# Patient Record
Sex: Female | Born: 1970 | Race: White | Hispanic: No | Marital: Married | Smoking: Never smoker
Health system: Southern US, Community
[De-identification: ages and names within clinical notes are randomized; demographics above are authoritative.]

---

## 2020-09-21 ENCOUNTER — Other Ambulatory Visit: Payer: Self-pay

## 2020-09-21 ENCOUNTER — Ambulatory Visit
Admission: EM | Admit: 2020-09-21 | Discharge: 2020-09-21 | Disposition: A | Discharge status: Home / Self Care | Payer: BC Managed Care – PPO | Attending: Family Medicine | Admitting: Family Medicine

## 2020-09-21 ENCOUNTER — Ambulatory Visit: Admit: 2020-09-21 | Disposition: A | Discharge status: Home / Self Care | Payer: Self-pay

## 2020-09-21 DIAGNOSIS — J0110 Acute frontal sinusitis, unspecified: Secondary | ICD-10-CM | POA: Diagnosis not present

## 2020-09-21 MED ORDER — AMOXICILLIN 500 MG PO CAPS: 1000.0000 mg | ORAL_CAPSULE | Freq: Three times a day (TID) | ORAL | 0 refills | Status: AC

## 2020-09-21 NOTE — ED Provider Notes (Signed)
Renaldo Fiddler    CSN: [id number] Arrival date & time: 09/21/20  1310      History   Chief Complaint Chief Complaint  Patient presents with  . Cough    HPI Raven Thomas is Thomas 49 y.o. female.   Patient is Thomas 49 year old female who presents today with nonproductive cough, nasal congestion, rhinorrhea, feeling fatigue since 12/23.  Symptoms are worsening.  Taking over-the-counter medicines without much relief.  Sinus pressure and headache. No fever.       History reviewed. No pertinent past medical history.  There are no problems to display for this patient.   History reviewed. No pertinent surgical history.  OB History   No obstetric history on file.      Home Medications    Prior to Admission medications   Medication Sig Start Date End Date Taking? Authorizing Provider  amoxicillin (AMOXIL) 500 MG capsule Take 2 capsules (1,000 mg total) by mouth 3 (three) times daily for 5 days. 09/21/20 09/26/20 Yes Janace Aris, NP    Family History No family history on file.  Social History Social History   Tobacco Use  . Smoking status: Never Smoker  . Smokeless tobacco: Never Used  Substance Use Topics  . Alcohol use: Never  . Drug use: Never     Allergies   Patient has no known allergies.   Review of Systems Review of Systems   Physical Exam Triage Vital Signs ED Triage Vitals  Enc Vitals Group     BP 09/21/20 1316 111/73     Pulse Rate 09/21/20 1316 70     Resp 09/21/20 1316 16     Temp 09/21/20 1316 98.1 F (36.7 C)     Temp Source 09/21/20 1316 Oral     SpO2 09/21/20 1316 96 %     Weight 09/21/20 1319 210 lb (95.3 kg)     Height 09/21/20 1319 5\' 5"  (1.651 m)     Head Circumference --      Peak Flow --      Pain Score 09/21/20 1319 0     Pain Loc --      Pain Edu? --      Excl. in [place]? --    No data found.  Updated Vital Signs BP 111/73 (BP Location: Left Arm)   Pulse 70   Temp 98.1 F (36.7 C) (Oral)   Resp 16   Ht 5\' 5"   (1.651 m)   Wt 210 lb (95.3 kg)   LMP 08/31/2020   SpO2 96%   BMI 34.95 kg/m   Visual Acuity Right Eye Distance:   Left Eye Distance:   Bilateral Distance:    Right Eye Near:   Left Eye Near:    Bilateral Near:     Physical Exam Vitals and nursing note reviewed.  Constitutional:      General: She is not in acute distress.    Appearance: Normal appearance. She is not ill-appearing, toxic-appearing or diaphoretic.  HENT:     Head: Normocephalic.     Right Ear: Tympanic membrane and ear canal normal.     Left Ear: Tympanic membrane and ear canal normal.     Nose: Congestion and rhinorrhea present.     Mouth/Throat:     Pharynx: Oropharynx is clear.  Eyes:     Conjunctiva/sclera: Conjunctivae normal.  Cardiovascular:     Rate and Rhythm: Normal rate and regular rhythm.  Pulmonary:     Effort: Pulmonary effort is normal.  Comments: Decreased lung sounds to right lower Musculoskeletal:        General: Normal range of motion.     Cervical back: Normal range of motion.  Skin:    General: Skin is warm and dry.     Findings: No rash.  Neurological:     Mental Status: She is alert.  Psychiatric:        Mood and Affect: Mood normal.      UC Treatments / Results  Labs (all labs ordered are listed, but only abnormal results are displayed) Labs Reviewed  NOVEL CORONAVIRUS, NAA    EKG   Radiology No results found.  Procedures Procedures (including critical care time)  Medications Ordered in UC Medications - No data to display  Initial Impression / Assessment and Plan / UC Course  I have reviewed the triage vital signs and the nursing notes.  Pertinent labs & imaging results that were available during my care of the patient were reviewed by me and considered in my medical decision making (see chart for details).     Acute nonrecurrent frontal sinusitis. Patient with 7 days of symptoms and worsening.  We will go ahead and cover with antibiotics this  time. Amoxicillin as prescribed.  Flonase and Zyrtec for symptoms Follow up as needed for continued or worsening symptoms  Final Clinical Impressions(s) / UC Diagnoses   Final diagnoses:  Acute non-recurrent frontal sinusitis     Discharge Instructions     Take the antibiotics as prescribed.  You can continue over-the-counter medicines as needed.  Recommended  Flonase and Zyrtec Follow up as needed for continued or worsening symptoms     ED Prescriptions    Medication Sig Dispense Auth. Provider   amoxicillin (AMOXIL) 500 MG capsule Take 2 capsules (1,000 mg total) by mouth 3 (three) times daily for 5 days. 30 capsule Raven Weich A, NP     PDMP not reviewed this encounter.   Janace Aris, NP 09/21/20 1348

## 2020-09-21 NOTE — Discharge Instructions (Addendum)
Take the antibiotics as prescribed.  You can continue over-the-counter medicines as needed.  Recommended  Flonase and Zyrtec Follow up as needed for continued or worsening symptoms

## 2020-09-21 NOTE — ED Triage Notes (Signed)
Pt reports having non productive cough, nasal congestion and feeling fatigue since 12/23.

## 2020-09-23 LAB — SARS-COV-2, NAA 2 DAY TAT

## 2020-09-23 LAB — NOVEL CORONAVIRUS, NAA: SARS-CoV-2, NAA: NOT DETECTED

## 2020-10-12 ENCOUNTER — Other Ambulatory Visit: Payer: Self-pay | Admitting: Family

## 2020-10-12 DIAGNOSIS — R1900 Intra-abdominal and pelvic swelling, mass and lump, unspecified site: Secondary | ICD-10-CM

## 2020-10-12 DIAGNOSIS — R1909 Other intra-abdominal and pelvic swelling, mass and lump: Secondary | ICD-10-CM

## 2020-10-17 ENCOUNTER — Other Ambulatory Visit: Payer: Self-pay

## 2020-10-17 ENCOUNTER — Ambulatory Visit
Admission: RE | Admit: 2020-10-17 | Discharge: 2020-10-17 | Disposition: A | Discharge status: Home / Self Care | Payer: BC Managed Care – PPO | Source: Ambulatory Visit | Attending: Family | Admitting: Family

## 2020-10-17 DIAGNOSIS — R1909 Other intra-abdominal and pelvic swelling, mass and lump: Secondary | ICD-10-CM | POA: Diagnosis present

## 2022-01-14 ENCOUNTER — Other Ambulatory Visit: Payer: Self-pay | Admitting: Family Medicine

## 2022-01-14 DIAGNOSIS — Z1231 Encounter for screening mammogram for malignant neoplasm of breast: Secondary | ICD-10-CM

## 2022-02-13 ENCOUNTER — Ambulatory Visit
Admission: RE | Admit: 2022-02-13 | Discharge: 2022-02-13 | Disposition: A | Discharge status: Home / Self Care | Payer: BC Managed Care – PPO | Source: Ambulatory Visit | Attending: Family Medicine | Admitting: Family Medicine

## 2022-02-13 DIAGNOSIS — Z1231 Encounter for screening mammogram for malignant neoplasm of breast: Secondary | ICD-10-CM | POA: Insufficient documentation

## 2022-02-15 ENCOUNTER — Inpatient Hospital Stay
Admission: RE | Admit: 2022-02-15 | Discharge: 2022-02-15 | Disposition: A | Discharge status: Home / Self Care | Payer: Self-pay | Source: Ambulatory Visit | Attending: *Deleted | Admitting: *Deleted

## 2022-02-15 ENCOUNTER — Other Ambulatory Visit: Payer: Self-pay | Admitting: *Deleted

## 2022-02-15 DIAGNOSIS — Z1231 Encounter for screening mammogram for malignant neoplasm of breast: Secondary | ICD-10-CM

## 2022-10-21 ENCOUNTER — Other Ambulatory Visit: Payer: Self-pay

## 2022-10-21 ENCOUNTER — Encounter: Payer: Self-pay | Admitting: Emergency Medicine

## 2022-10-21 ENCOUNTER — Emergency Department: Payer: BC Managed Care – PPO

## 2022-10-21 ENCOUNTER — Emergency Department
Admission: EM | Admit: 2022-10-21 | Discharge: 2022-10-21 | Disposition: A | Discharge status: Home / Self Care | Payer: BC Managed Care – PPO | Attending: Emergency Medicine | Admitting: Emergency Medicine

## 2022-10-21 DIAGNOSIS — S01111A Laceration without foreign body of right eyelid and periocular area, initial encounter: Secondary | ICD-10-CM | POA: Diagnosis not present

## 2022-10-21 DIAGNOSIS — W01198A Fall on same level from slipping, tripping and stumbling with subsequent striking against other object, initial encounter: Secondary | ICD-10-CM | POA: Insufficient documentation

## 2022-10-21 DIAGNOSIS — R55 Syncope and collapse: Secondary | ICD-10-CM | POA: Insufficient documentation

## 2022-10-21 DIAGNOSIS — S0990XA Unspecified injury of head, initial encounter: Secondary | ICD-10-CM | POA: Diagnosis present

## 2022-10-21 LAB — BASIC METABOLIC PANEL
Anion gap: 7 (ref 5–15)
BUN: 19 mg/dL (ref 6–20)
CO2: 24 mmol/L (ref 22–32)
Calcium: 8.1 mg/dL — ABNORMAL LOW (ref 8.9–10.3)
Chloride: 101 mmol/L (ref 98–111)
Creatinine, Ser: 0.72 mg/dL (ref 0.44–1.00)
GFR, Estimated: >60 mL/min (ref >60)
Glucose, Bld: 130 mg/dL — ABNORMAL HIGH (ref 70–99)
Potassium: 3.7 mmol/L (ref 3.5–5.1)
Sodium: 132 mmol/L — ABNORMAL LOW (ref 135–145)

## 2022-10-21 LAB — CBC
HCT: 38.9 % (ref 36.0–46.0)
Hemoglobin: 12.3 g/dL (ref 12.0–15.0)
MCH: 26.8 pg (ref 26.0–34.0)
MCHC: 31.6 g/dL (ref 30.0–36.0)
MCV: 84.7 fL (ref 80.0–100.0)
Platelets: 279 10*3/uL (ref 150–400)
RBC: 4.59 MIL/uL (ref 3.87–5.11)
RDW: 14.4 % (ref 11.5–15.5)
WBC: 8.2 10*3/uL (ref 4.0–10.5)
nRBC: 0 % (ref 0.0–0.2)

## 2022-10-21 MED ORDER — SODIUM CHLORIDE 0.9 % IV BOLUS
500.0000 mL | Freq: Once | INTRAVENOUS | Status: AC
  Administered 2022-10-21: 500 mL via INTRAVENOUS

## 2022-10-21 NOTE — ED Provider Notes (Signed)
[facility] Provider Note    Event Date/Time   First MD Initiated Contact with Patient 10/21/22 1647     (approximate)   History   Loss of Consciousness   HPI  Raven Thomas is a 52 y.o. female with a history of allergies, depression, and GERD who presents with syncope and head injury acute onset this afternoon.  The patient states that she had been lying down for a while and then was having diarrhea.  She got up to leave the house and suddenly started to feel lightheaded for a few moments and then passed out.  She has had previous episodes of syncope although in the past usually feels dizzy for a little bit longer.  She fell and hit her head just above her right eyebrow.  She denies any other injuries.  Her blood pressure was low initially but it has now improved.  She denies any acute symptoms and states she is feeling much better.  I reviewed the past medical records.  The patient's most recent outpatient visit was on 06/04/2022 for ankle pain.  She had a telephone encounter on 12/29 with internal medicine for cough and URI symptoms.   Physical Exam   Triage Vital Signs: ED Triage Vitals  Enc Vitals Group     BP 10/21/22 1436 (!) 85/55     Pulse Rate 10/21/22 1436 64     Resp 10/21/22 1436 18     Temp 10/21/22 1436 98.6 F (37 C)     Temp Source 10/21/22 1436 Oral     SpO2 10/21/22 1436 97 %     Weight 10/21/22 1416 210 lb 1.6 oz (95.3 kg)     Height 10/21/22 1416 5\' 5"  (1.651 m)     Head Circumference --      Peak Flow --      Pain Score 10/21/22 1434 7     Pain Loc --      Pain Edu? --      Excl. in [place]? --     Most recent vital signs: Vitals:   10/21/22 1700 10/21/22 1730  BP: 122/66 (!) 117/58  Pulse: 68 68  Resp: 16   Temp:    SpO2: 100% 100%     General: Awake, no distress.  CV:  Good peripheral perfusion.  Resp:  Normal effort.  Abd:  No distention.  Other:  EOMI.  PERRLA.  Motor and sensory intact in all extremities.  No  facial droop.  No ataxia on finger-to-nose.  Normal coordination.  2 cm superficial laceration just above the lateral right eyebrow.   ED Results / Procedures / Treatments   Labs (all labs ordered are listed, but only abnormal results are displayed) Labs Reviewed  BASIC METABOLIC PANEL - Abnormal; Notable for the following components:      Result Value   Sodium 132 (*)    Glucose, Bld 130 (*)    Calcium 8.1 (*)    All other components within normal limits  CBC  URINALYSIS, ROUTINE W REFLEX MICROSCOPIC  CBG MONITORING, ED  POC URINE PREG, ED     EKG  ED ECG REPORT I, Arta Silence, the attending physician, personally viewed and interpreted this ECG.  Date: 10/21/2022 EKG Time: 1448 Rate: 64 Rhythm: normal sinus rhythm QRS Axis: normal Intervals: normal ST/T Wave abnormalities: normal Narrative Interpretation: no evidence of acute ischemia    RADIOLOGY  CT head: I independently viewed and interpreted the images; there is no ICH.  Radiology report indicates no acute abnormality  PROCEDURES:  Critical Care performed: No  ..Laceration Repair  Date/Time: 10/21/2022 5:51 PM  Performed by: Arta Silence, MD Authorized by: Arta Silence, MD   Consent:    Consent obtained:  Verbal   Consent given by:  Patient Universal protocol:    Patient identity confirmed:  Verbally with patient Anesthesia:    Anesthesia method:  None Laceration details:    Location:  Face   Face location:  R eyebrow   Length (cm):  2 Treatment:    Amount of cleaning:  Standard Skin repair:    Repair method:  Tissue adhesive Approximation:    Approximation:  Close Repair type:    Repair type:  Simple Post-procedure details:    Procedure completion:  Tolerated well, no immediate complications    MEDICATIONS ORDERED IN ED: Medications  sodium chloride 0.9 % bolus 500 mL (0 mLs Intravenous Stopped 10/21/22 1656)     IMPRESSION / MDM / [place] / ED  COURSE  I reviewed the triage vital signs and the nursing notes.  52 year old female with PMH as noted above presents with a syncopal episode after having diarrhea and then getting up relatively quickly.  She was diagnosed with COVID-19 a few days ago.  Physical exam is unremarkable for acute findings.  Differential diagnosis includes, but is not limited to, vasovagal syncope, orthostatic hypotension, dehydration/hypovolemia.  There is no evidence of cardiac etiology.  EKG shows no acute findings.  Basic labs were obtained and show no acute abnormalities.  Electrolytes are normal.  There is no leukocytosis.  CT head was also obtained due to the head injury and it is negative.  The patient was slightly hypotensive on arrival but was given a small fluid bolus and the blood pressure is now normal.  She is asymptomatic.  Patient's presentation is most consistent with acute presentation with potential threat to life or bodily function.  The patient is on the cardiac monitor to evaluate for evidence of arrhythmia and/or significant heart rate changes.  The small laceration was repaired with Dermabond.  The patient's tetanus is up-to-date.  At this time, based on the patient's normal vital signs and well appearance, the resolution of her symptoms, and the normal workup, she is appropriate for discharge home.  I counseled her on the results of the workup.  I gave her strict return precautions and she expressed understanding.   FINAL CLINICAL IMPRESSION(S) / ED DIAGNOSES   Final diagnoses:  Syncope, unspecified syncope type     Rx / DC Orders   ED Discharge Orders     None        Note:  This document was prepared using Dragon voice recognition software and may include unintentional dictation errors.    Arta Silence, MD 10/21/22 1754

## 2022-10-21 NOTE — Discharge Instructions (Signed)
Make sure to drink plenty of fluids.  Return to the ER for new or worsening dizziness or lightheadedness, recurrent episodes of passing out, or any other new or worsening symptoms that concern you.

## 2022-10-21 NOTE — ED Notes (Signed)
This RN to bedside, pt resting in stretcher a&ox4 on room air in NAD. Laceration to right eyebrow noted, not actively bleeding.

## 2022-10-21 NOTE — ED Triage Notes (Addendum)
Presents via EMS from home  with low b/p  EMS states she had syncopal episode     Was orthostatic on their arrival  500 ml bolus given  pt is COVID positive

## 2022-10-21 NOTE — ED Notes (Signed)
Pt wheeled to front circle with all belongings and discharge papers where spouse picked her up, ambulated from wheelchair to vehicle on room air in NAD.

## 2023-07-09 ENCOUNTER — Other Ambulatory Visit: Payer: Self-pay | Admitting: Family Medicine

## 2023-07-09 DIAGNOSIS — Z1231 Encounter for screening mammogram for malignant neoplasm of breast: Secondary | ICD-10-CM

## 2023-11-21 ENCOUNTER — Ambulatory Visit
Admission: RE | Admit: 2023-11-21 | Discharge: 2023-11-21 | Disposition: A | Discharge status: Home / Self Care | Payer: No Typology Code available for payment source | Source: Ambulatory Visit | Attending: Family Medicine | Admitting: Family Medicine

## 2023-11-21 ENCOUNTER — Encounter: Payer: Self-pay | Admitting: Radiology

## 2023-11-21 DIAGNOSIS — Z1231 Encounter for screening mammogram for malignant neoplasm of breast: Secondary | ICD-10-CM | POA: Insufficient documentation

## 2024-01-04 IMAGING — MG MM DIGITAL SCREENING BILAT W/ TOMO AND CAD
8 series · 8 of 24 positions shown · non-contrast
Comparison: Previous exam(s).

CLINICAL DATA: Screening.

EXAM:
DIGITAL SCREENING BILATERAL MAMMOGRAM WITH TOMOSYNTHESIS AND CAD
TECHNIQUE: Bilateral screening digital craniocaudal and mediolateral oblique
mammograms were obtained. Bilateral screening digital breast
tomosynthesis was performed. The images were evaluated with
computer-aided detection.

[L MLO synth-2D]
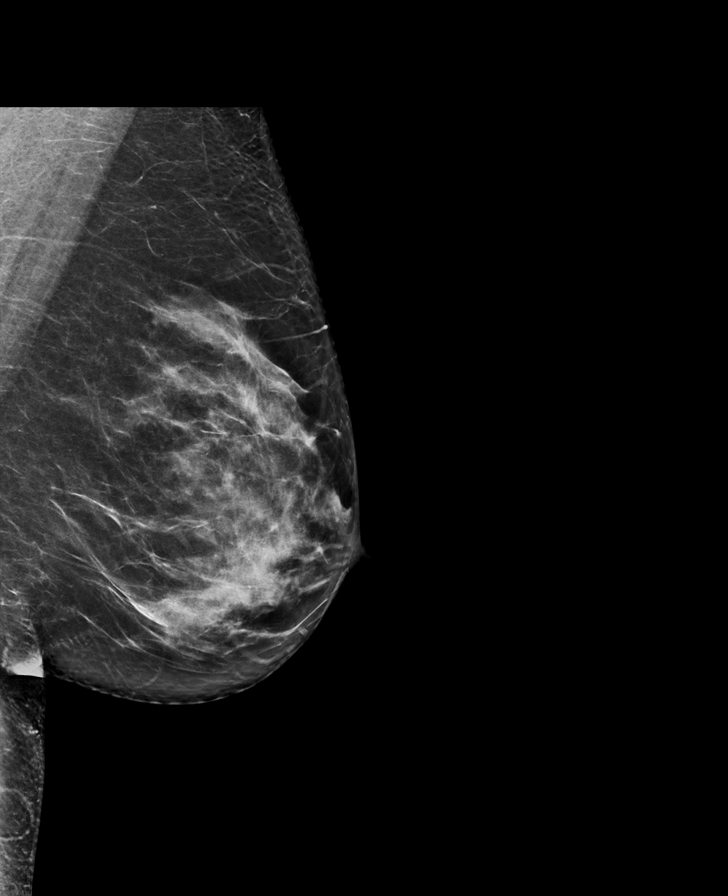

[R CC synth-2D]
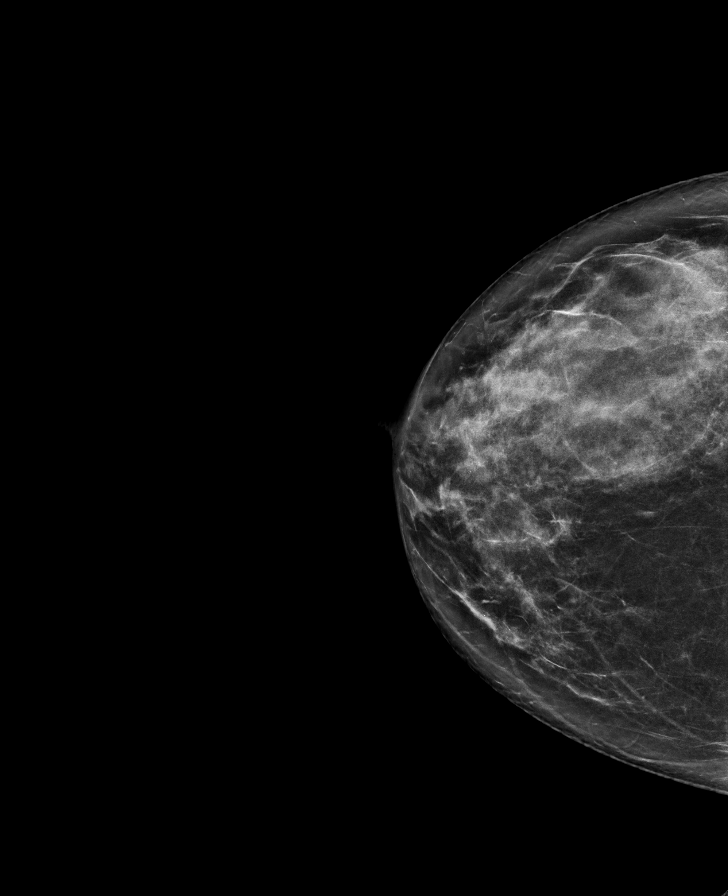

[L CC synth-2D]
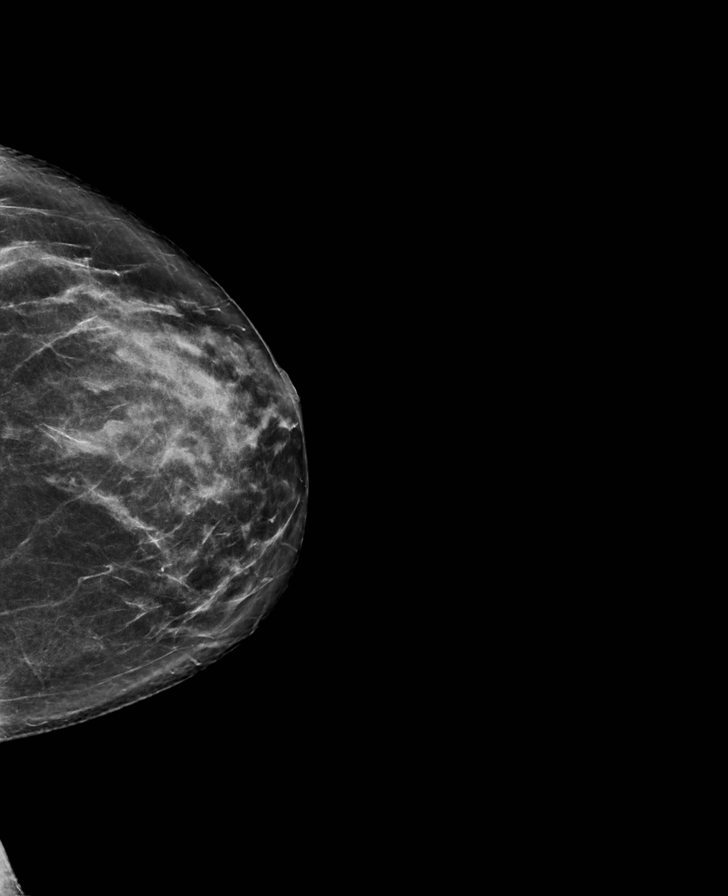

[R MLO synth-2D]
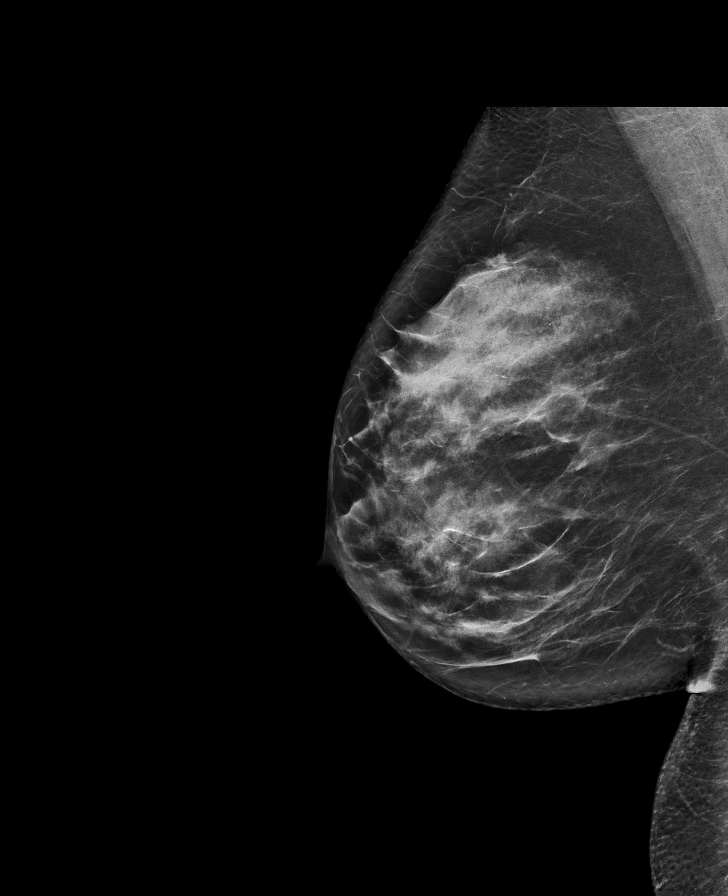

[R CC tomo · tomo slice 37/73.0]
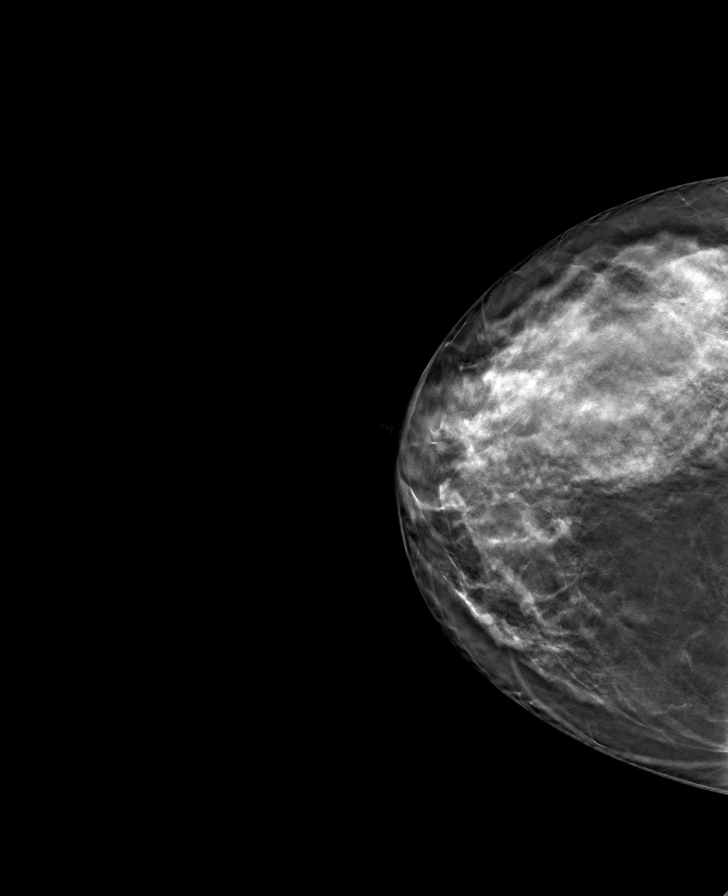

[L MLO tomo · tomo slice 41/82.0]
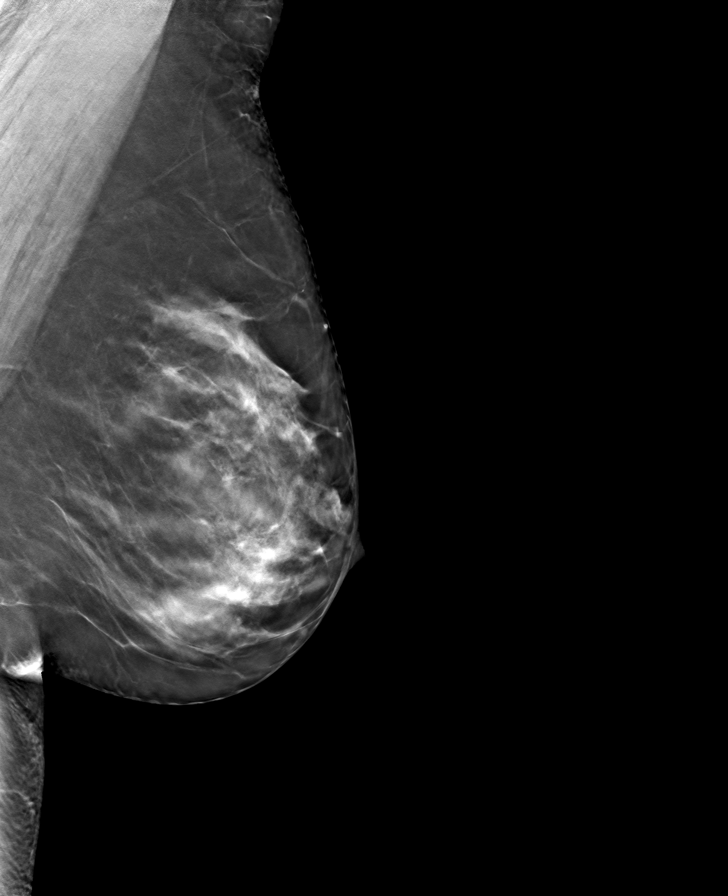

[R MLO tomo · tomo slice 41/81.0]
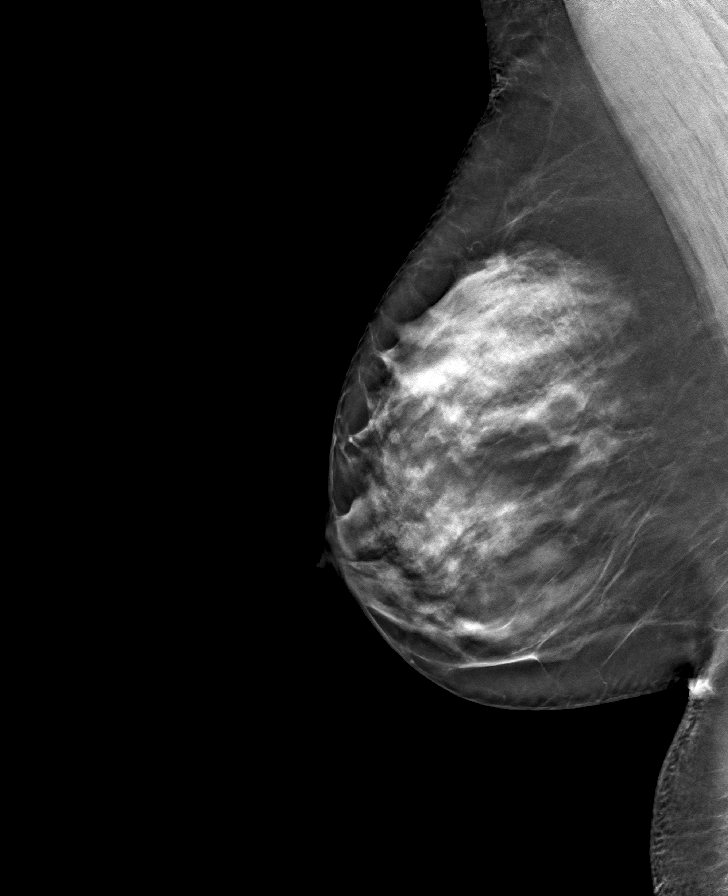

[L CC tomo · tomo slice 39/76.0]
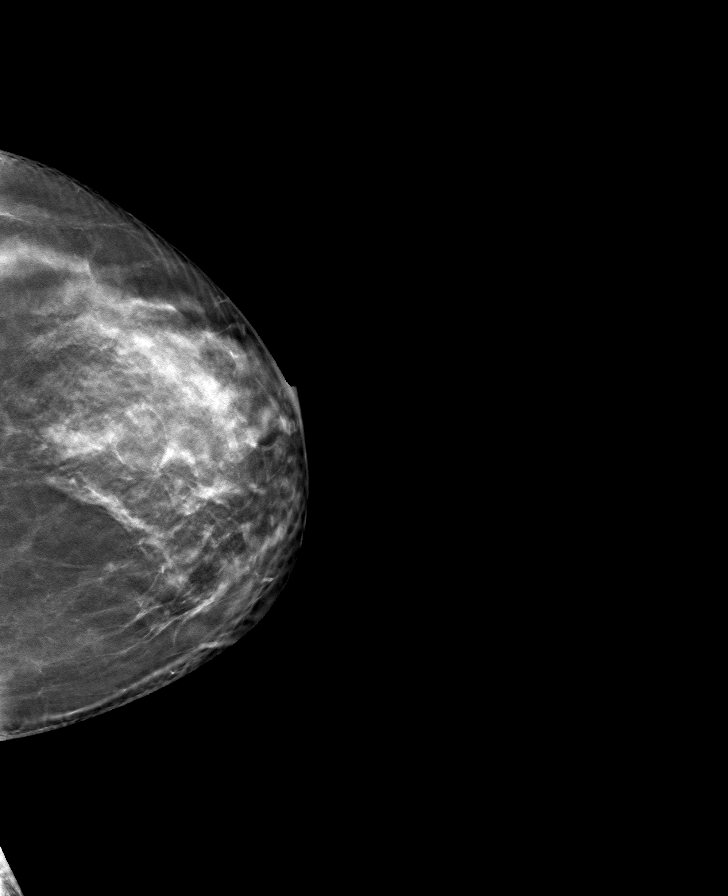

[8 of 24 positions shown; findings below may reference images not displayed]

ACR Breast Density Category c: The breast tissue is heterogeneously
dense, which may obscure small masses.
FINDINGS: There are no findings suspicious for malignancy.
IMPRESSION: No mammographic evidence of malignancy. A result letter of this
screening mammogram will be mailed directly to the patient.

RECOMMENDATION:
Screening mammogram in one year. (Code:Q3-W-BC3)

BI-RADS CATEGORY  1: Negative.
